# Patient Record
Sex: Male | Born: 1985 | Race: Black or African American | Hispanic: No | Marital: Married | Smoking: Current every day smoker
Health system: Southern US, Community
[De-identification: ages and names within clinical notes are randomized; demographics above are authoritative.]

---

## 2008-01-09 ENCOUNTER — Ambulatory Visit: Payer: Self-pay | Admitting: Internal Medicine

## 2008-11-01 IMAGING — CT CT ABD-PELV W/ CM
1 of 2 series · 15 of 32 positions shown, 19 images · non-contrast
Comparison: none

REASON FOR EXAM: RLQ abdominal pain, nausea and vomiting, white red cells
too numerous to count
COMMENTS:

PROCEDURE:     CT  - CT ABDOMEN / PELVIS  W  - January 09, 2008  [DATE]
RESULT:     The patient has a history of RIGHT lower quadrant pain.
TECHNIQUE: IV contrast enhanced CT of abdomen and pelvis is obtained.
There are no prior studies available for comparison.

[Series 2: appendicitis · axial · 0.74mm/px · z∈[-946,-560]mm · 15 of 146 slices shown, 19 images]
[im 11/146  soft-tissue]
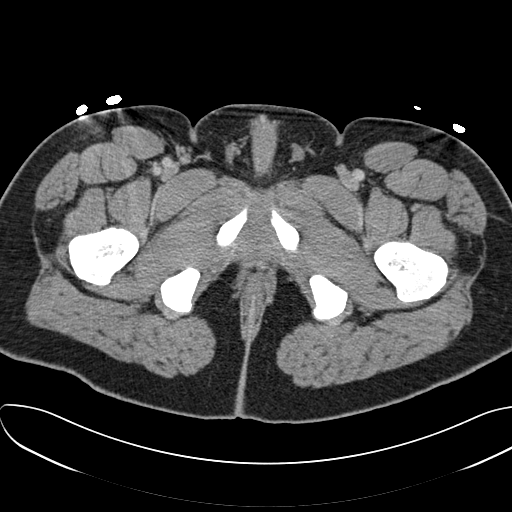
[im 11/146  bone]
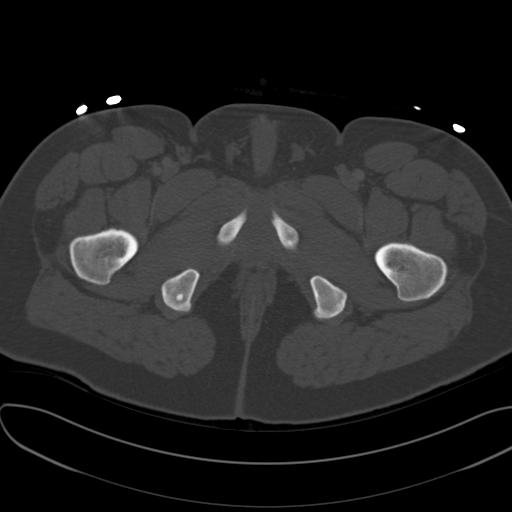
[im 22/146  soft-tissue]
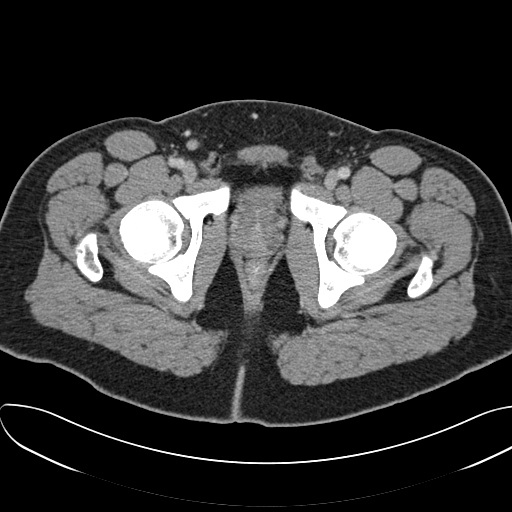
[im 33/146  soft-tissue]
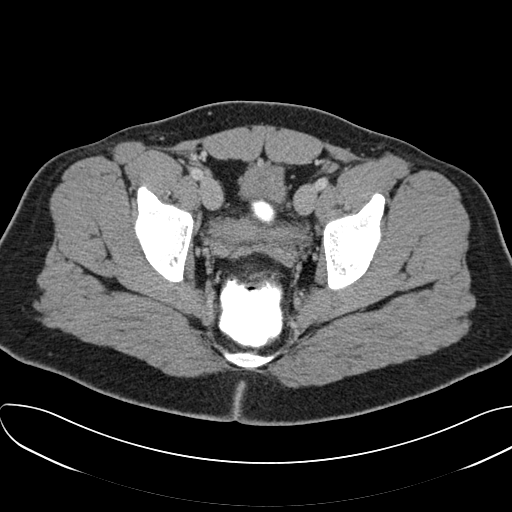
[im 43/146  soft-tissue]
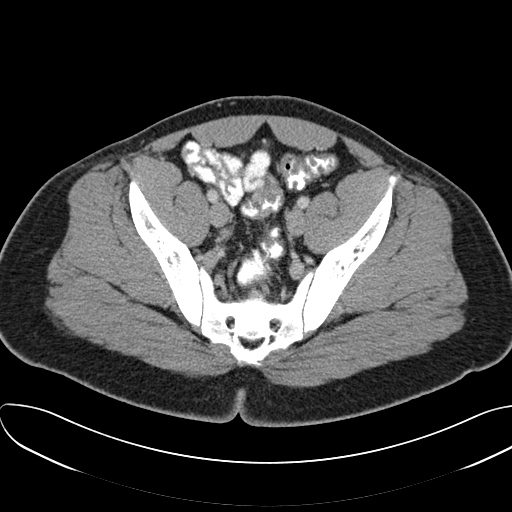
[im 54/146  soft-tissue]
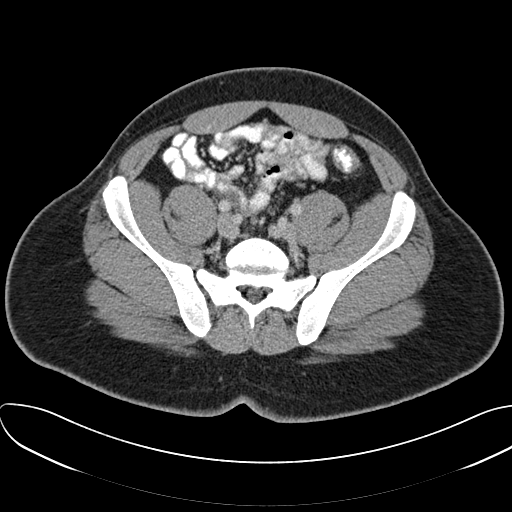
[im 65/146  soft-tissue]
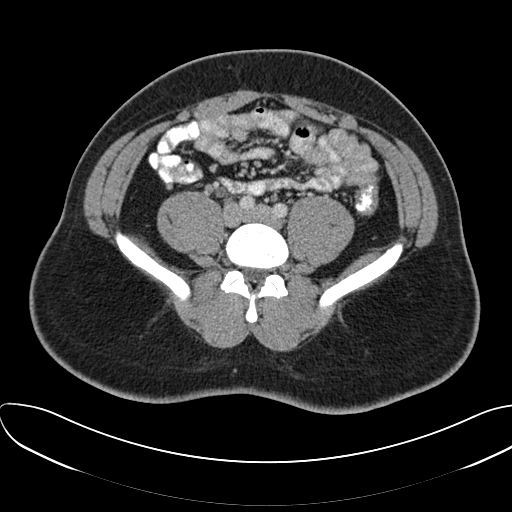
[im 76/146  soft-tissue]
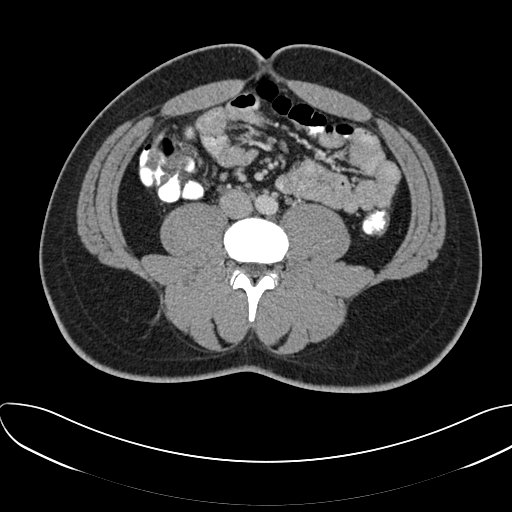
[im 86/146  soft-tissue]
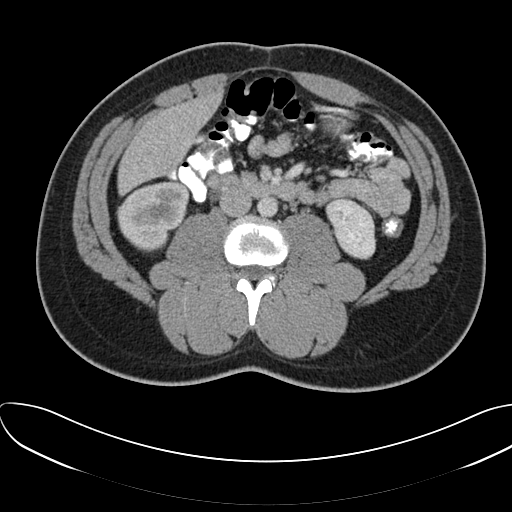
[im 97/146  soft-tissue]
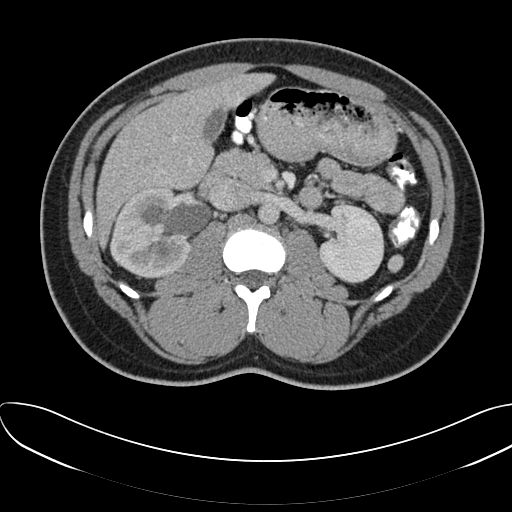
[im 97/146  bone]
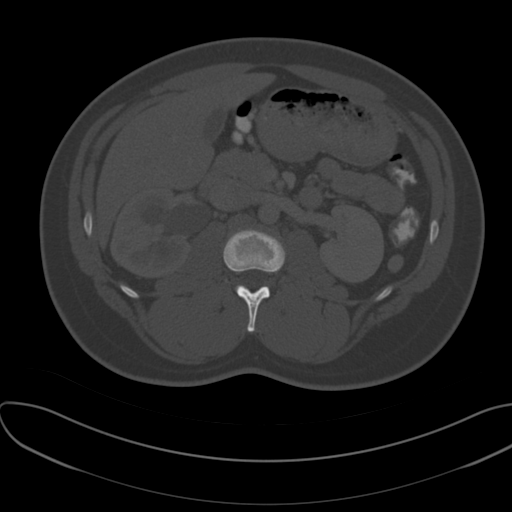
[im 108/146  soft-tissue]
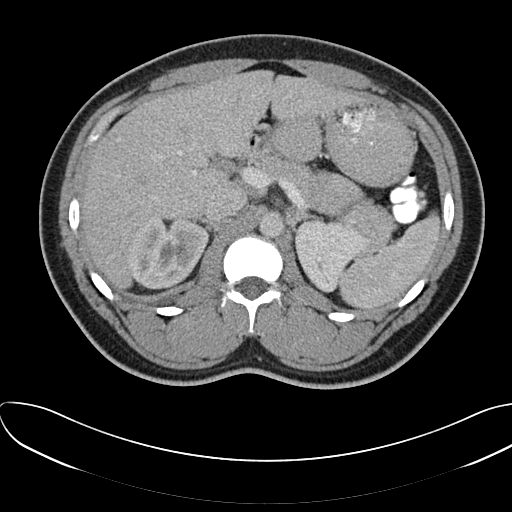
[im 119/146  soft-tissue]
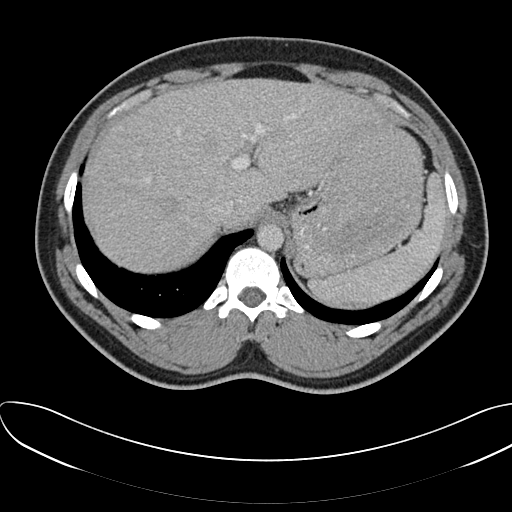
[im 124/146  lung]
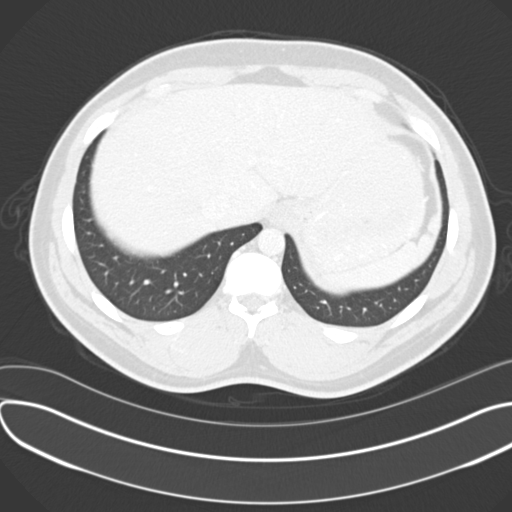
[im 129/146  soft-tissue]
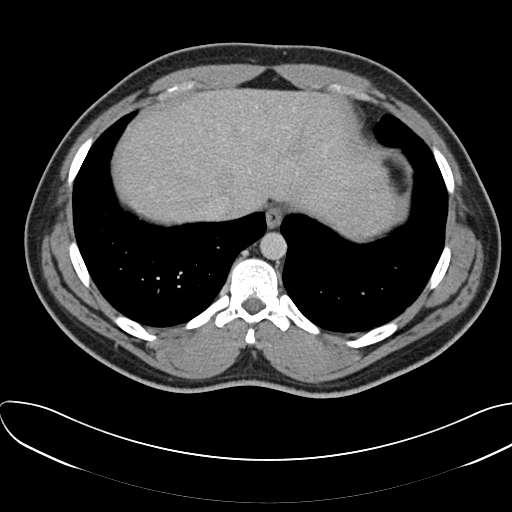
[im 129/146  lung]
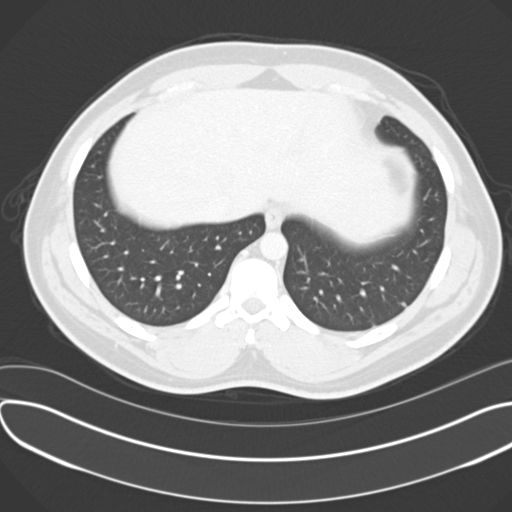
[im 135/146  lung]
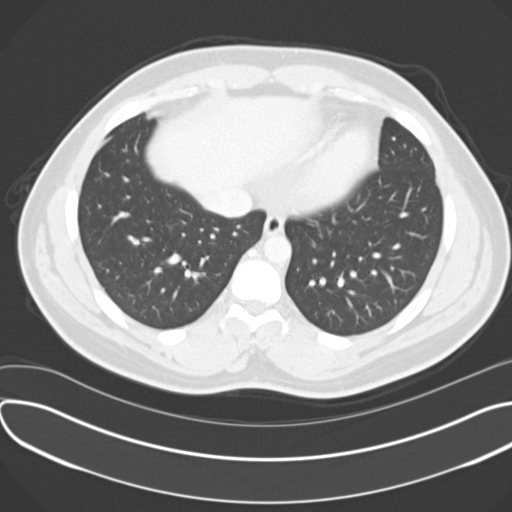
[im 140/146  soft-tissue]
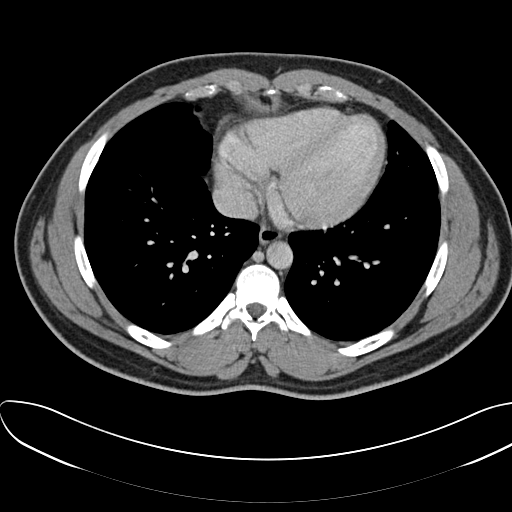
[im 140/146  lung]
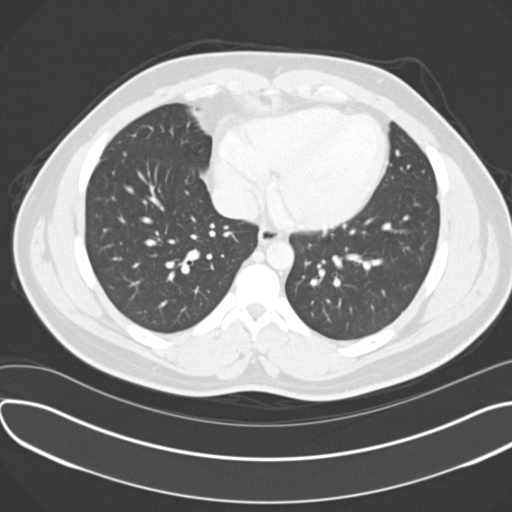

[15 of 32 positions shown; findings below may reference images not displayed]

FINDINGS: The liver and spleen are normal. The pancreas is normal. The
adrenals are normal. The LEFT kidney is unremarkable. Right hydronephrosis
and hydroureter is present. There is a 6.0 mm, distal, RIGHT ureteral stone.
The bladder is collapsed. Small inguinal lymph nodes are noted. No
pathologic pelvic fluid collections are noted. The abdominal aorta is
unremarkable. The RIGHT lower quadrant is unremarkable.
IMPRESSION: There is a 6.0 mm, distal, RIGHT ureteral stone. RIGHT
hydronephrosis and hydroureter is present.

This report was phoned to the patient's physician.

## 2014-05-06 ENCOUNTER — Emergency Department: Payer: Self-pay | Admitting: Emergency Medicine

## 2014-05-06 LAB — URINALYSIS, COMPLETE
Bacteria: NONE SEEN
Glucose,UR: NEGATIVE mg/dL (ref 0–75)
Ketone: NEGATIVE
Leukocyte Esterase: NEGATIVE
Nitrite: NEGATIVE
PH: 8 (ref 4.5–8.0)
Protein: NEGATIVE
RBC,UR: <1 /HPF (ref 0–5)
SQUAMOUS EPITHELIAL: NONE SEEN
Specific Gravity: 1.014 (ref 1.003–1.030)
WBC UR: NONE SEEN /HPF (ref 0–5)

## 2014-05-06 LAB — DRUG SCREEN, URINE
Amphetamines, Ur Screen: NEGATIVE (ref ?–1000)
Barbiturates, Ur Screen: NEGATIVE (ref ?–200)
Benzodiazepine, Ur Scrn: NEGATIVE (ref ?–200)
COCAINE METABOLITE, UR ~~LOC~~: NEGATIVE (ref ?–300)
Cannabinoid 50 Ng, Ur ~~LOC~~: NEGATIVE (ref ?–50)
MDMA (ECSTASY) UR SCREEN: NEGATIVE (ref ?–500)
Methadone, Ur Screen: NEGATIVE (ref ?–300)
Opiate, Ur Screen: NEGATIVE (ref ?–300)
Phencyclidine (PCP) Ur S: NEGATIVE (ref ?–25)
TRICYCLIC, UR SCREEN: NEGATIVE (ref ?–1000)

## 2014-05-06 LAB — CBC
HCT: 45.5 % (ref 40.0–52.0)
HGB: 14.9 g/dL (ref 13.0–18.0)
MCH: 29.3 pg (ref 26.0–34.0)
MCHC: 32.7 g/dL (ref 32.0–36.0)
MCV: 90 fL (ref 80–100)
Platelet: 216 10*3/uL (ref 150–440)
RBC: 5.08 10*6/uL (ref 4.40–5.90)
RDW: 13.7 % (ref 11.5–14.5)
WBC: 4.9 10*3/uL (ref 3.8–10.6)

## 2014-05-06 LAB — COMPREHENSIVE METABOLIC PANEL
ALBUMIN: 3.2 g/dL — AB (ref 3.4–5.0)
Alkaline Phosphatase: 61 U/L
Anion Gap: 6 — ABNORMAL LOW (ref 7–16)
BUN: 7 mg/dL (ref 7–18)
Bilirubin,Total: 0.4 mg/dL (ref 0.2–1.0)
CALCIUM: 8.4 mg/dL — AB (ref 8.5–10.1)
CHLORIDE: 107 mmol/L (ref 98–107)
CREATININE: 0.81 mg/dL (ref 0.60–1.30)
Co2: 26 mmol/L (ref 21–32)
EGFR (Non-African Amer.): >60
Glucose: 103 mg/dL — ABNORMAL HIGH (ref 65–99)
Osmolality: 276 (ref 275–301)
Potassium: 3.6 mmol/L (ref 3.5–5.1)
SGOT(AST): 23 U/L (ref 15–37)
SGPT (ALT): 42 U/L (ref 12–78)
Sodium: 139 mmol/L (ref 136–145)
TOTAL PROTEIN: 7.2 g/dL (ref 6.4–8.2)

## 2014-05-06 LAB — ACETAMINOPHEN LEVEL: Acetaminophen: <2 ug/mL

## 2014-05-06 LAB — SALICYLATE LEVEL: Salicylates, Serum: <1.7 mg/dL

## 2014-05-06 LAB — ETHANOL

## 2014-06-10 ENCOUNTER — Emergency Department: Payer: Self-pay | Admitting: Emergency Medicine

## 2015-12-08 ENCOUNTER — Encounter (HOSPITAL_BASED_OUTPATIENT_CLINIC_OR_DEPARTMENT_OTHER): Payer: Self-pay | Admitting: *Deleted

## 2015-12-08 ENCOUNTER — Emergency Department (HOSPITAL_BASED_OUTPATIENT_CLINIC_OR_DEPARTMENT_OTHER)
Admission: EM | Admit: 2015-12-08 | Discharge: 2015-12-08 | Disposition: A | Discharge status: Home / Self Care | Payer: Self-pay | Attending: Emergency Medicine | Admitting: Emergency Medicine

## 2015-12-08 DIAGNOSIS — H1132 Conjunctival hemorrhage, left eye: Secondary | ICD-10-CM | POA: Insufficient documentation

## 2015-12-08 DIAGNOSIS — Y998 Other external cause status: Secondary | ICD-10-CM | POA: Insufficient documentation

## 2015-12-08 DIAGNOSIS — Y9389 Activity, other specified: Secondary | ICD-10-CM | POA: Insufficient documentation

## 2015-12-08 DIAGNOSIS — F17210 Nicotine dependence, cigarettes, uncomplicated: Secondary | ICD-10-CM | POA: Insufficient documentation

## 2015-12-08 DIAGNOSIS — Y9289 Other specified places as the place of occurrence of the external cause: Secondary | ICD-10-CM | POA: Insufficient documentation
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
## 2015-12-08 NOTE — Discharge Instructions (Signed)
Subconjunctival Hemorrhage °Subconjunctival hemorrhage is bleeding that happens between the white part of your eye (sclera) and the clear membrane that covers the outside of your eye (conjunctiva). There are many tiny blood vessels near the surface of your eye. A subconjunctival hemorrhage happens when one or more of these vessels breaks and bleeds, causing a red patch to appear on your eye. This is similar to a bruise. °Depending on the amount of bleeding, the red patch may only cover a small area of your eye or it may cover the entire visible part of the sclera. If a lot of blood collects under the conjunctiva, there may also be swelling. Subconjunctival hemorrhages do not affect your vision or cause pain, but your eye may feel irritated if there is swelling. Subconjunctival hemorrhages usually do not require treatment, and they disappear on their own within two weeks. °CAUSES °This condition may be caused by: °· Mild trauma, such as rubbing your eye too hard. °· Severe trauma or blunt injuries. °· Coughing, sneezing, or vomiting. °· Straining, such as when lifting a heavy object. °· High blood pressure. °· Recent eye surgery. °· A history of diabetes. °· Certain medicines, especially blood thinners (anticoagulants). °· Other conditions, such as eye tumors, bleeding disorders, or blood vessel abnormalities. °Subconjunctival hemorrhages can happen without an obvious cause.  °SYMPTOMS  °Symptoms of this condition include: °· A bright red or dark red patch on the white part of the eye. °¨ The red area may spread out to cover a larger area of the eye before it goes away. °¨ The red area may turn brownish-yellow before it goes away. °· Swelling. °· Mild eye irritation. °DIAGNOSIS °This condition is diagnosed with a physical exam. If your subconjunctival hemorrhage was caused by trauma, your health care provider may refer you to an eye specialist (ophthalmologist) or another specialist to check for other injuries. You  may have other tests, including: °· An eye exam. °· A blood pressure check. °· Blood tests to check for bleeding disorders. °If your subconjunctival hemorrhage was caused by trauma, X-rays or a CT scan may be done to check for other injuries. °TREATMENT °Usually, no treatment is needed. Your health care provider may recommend eye drops or cold compresses to help with discomfort. °HOME CARE INSTRUCTIONS °· Take over-the-counter and prescription medicines only as directed by your health care provider. °· Use eye drops or cold compresses to help with discomfort as directed by your health care provider. °· Avoid activities, things, and environments that may irritate or injure your eye. °· Keep all follow-up visits as told by your health care provider. This is important. °SEEK MEDICAL CARE IF: °· You have pain in your eye. °· The bleeding does not go away within 3 weeks. °· You keep getting new subconjunctival hemorrhages. °SEEK IMMEDIATE MEDICAL CARE IF: °· Your vision changes or you have difficulty seeing. °· You suddenly develop severe sensitivity to light. °· You develop a severe headache, persistent vomiting, confusion, or abnormal tiredness (lethargy). °· Your eye seems to bulge or protrude from your eye socket. °· You develop unexplained bruises on your body. °· You have unexplained bleeding in another area of your body. °  °This information is not intended to replace advice given to you by your health care provider. Make sure you discuss any questions you have with your health care provider. °  °Document Released: 11/26/2005 Document Revised: 08/17/2015 Document Reviewed: 02/02/2015 °Elsevier Interactive Patient Education ©2016 [employer] ° °

## 2017-01-22 ENCOUNTER — Encounter (HOSPITAL_COMMUNITY): Payer: Self-pay | Admitting: *Deleted

## 2017-01-22 ENCOUNTER — Emergency Department (HOSPITAL_COMMUNITY)
Admission: EM | Admit: 2017-01-22 | Discharge: 2017-01-22 | Disposition: A | Discharge status: Home / Self Care | Payer: Self-pay | Attending: Emergency Medicine | Admitting: Emergency Medicine

## 2017-01-22 ENCOUNTER — Emergency Department (HOSPITAL_COMMUNITY): Payer: Self-pay

## 2017-01-22 DIAGNOSIS — W2201XA Walked into wall, initial encounter: Secondary | ICD-10-CM | POA: Insufficient documentation

## 2017-01-22 DIAGNOSIS — S62316A Displaced fracture of base of fifth metacarpal bone, right hand, initial encounter for closed fracture: Secondary | ICD-10-CM | POA: Insufficient documentation

## 2017-01-22 DIAGNOSIS — Y929 Unspecified place or not applicable: Secondary | ICD-10-CM | POA: Insufficient documentation

## 2017-01-22 DIAGNOSIS — F17210 Nicotine dependence, cigarettes, uncomplicated: Secondary | ICD-10-CM | POA: Insufficient documentation

## 2017-01-22 DIAGNOSIS — Z79899 Other long term (current) drug therapy: Secondary | ICD-10-CM | POA: Insufficient documentation

## 2017-01-22 DIAGNOSIS — Y939 Activity, unspecified: Secondary | ICD-10-CM | POA: Insufficient documentation

## 2017-01-22 DIAGNOSIS — Y999 Unspecified external cause status: Secondary | ICD-10-CM | POA: Insufficient documentation

## 2017-01-22 MED ORDER — IBUPROFEN 600 MG PO TABS: 600.0000 mg | ORAL_TABLET | Freq: Four times a day (QID) | ORAL | 0 refills | Status: AC | PRN

## 2017-01-22 NOTE — ED Notes (Signed)
Ortho tech en route 

## 2017-01-22 NOTE — ED Triage Notes (Signed)
Pt punched a wall, fist closed Sunday night. C/o pain in his right hand. No meds prior to arrival.

## 2017-01-22 NOTE — ED Provider Notes (Signed)
[facility]-EMERGENCY DEPT Provider Note   CSN: [id number] Arrival date & time: 01/22/17  1950    History   Chief Complaint Chief Complaint  Patient presents with  . Hand Pain    HPI Jordan Arroyo is a 31 y.o. male.  31 year old male presents to the emergency department for evaluation of right hand pain. He states that he punched a wall with a closed fist 2 days ago. Patient is complaining of a throbbing pain which has been fairly constant. It is worse with palpation to his hand. No medications taken prior to arrival. No history of broken bones. Patient is right-hand dominant.   The history is provided by the patient. No language interpreter was used.  Hand Pain     History reviewed. No pertinent past medical history.  There are no active problems to display for this patient.   History reviewed. No pertinent surgical history.     Home Medications    Prior to Admission medications   Medication Sig Start Date End Date Taking? Authorizing Provider  ibuprofen (ADVIL,MOTRIN) 600 MG tablet Take 1 tablet (600 mg total) by mouth every 6 (six) hours as needed. 01/22/17   Antony Madura, PA-C    Family History No family history on file.  Social History Social History  Substance Use Topics  . Smoking status: Current Every Day Smoker    Packs/day: 1.00    Types: Cigarettes  . Smokeless tobacco: Current User  . Alcohol use Yes     Allergies   Patient has no allergy information on record.   Review of Systems Review of Systems Ten systems reviewed and are negative for acute change, except as noted in the HPI.    Physical Exam Updated Vital Signs BP (!) 167/108 (BP Location: Left Arm)   Pulse 107   Temp 98.3 F (36.8 C) (Oral)   Resp 18   Ht 5\' 8"  (1.727 m)   Wt 94.3 kg   SpO2 100%   BMI 31.63 kg/m   Physical Exam  Constitutional: He is oriented to person, place, and time. He appears well-developed and well-nourished. No distress.  Nontoxic and in no acute  distress  HENT:  Head: Normocephalic and atraumatic.  Eyes: Conjunctivae and EOM are normal. No scleral icterus.  Neck: Normal range of motion.  Cardiovascular: Normal rate, regular rhythm and intact distal pulses.   Distal radial pulse 2+ in the right upper extremity. Capillary refill brisk in all digits of right hand.  Pulmonary/Chest: Effort normal. No respiratory distress.  Respirations even and unlabored. Lungs clear to auscultation bilaterally.  Musculoskeletal: Normal range of motion.       Right hand: He exhibits tenderness, bony tenderness and swelling. He exhibits normal range of motion and no deformity. Normal sensation noted. Decreased strength (Mild decreased strength with abduction of the right fifth digit against resistance.) noted.       Hands: Neurological: He is alert and oriented to person, place, and time. He exhibits normal muscle tone. Coordination normal.  Sensation to light touch intact in all digits of right hand.  Skin: Skin is warm and dry. No rash noted. He is not diaphoretic. No erythema. No pallor.  Psychiatric: He has a normal mood and affect. His behavior is normal.  Nursing note and vitals reviewed.    ED Treatments / Results  Labs (all labs ordered are listed, but only abnormal results are displayed) Labs Reviewed - No data to display  EKG  EKG Interpretation None  Radiology Dg Hand 2 View Right  Result Date: 01/22/2017 CLINICAL DATA:  Pain after punching wall EXAM: RIGHT HAND - 2 VIEW COMPARISON:  None. FINDINGS: Frontal and lateral views were obtained. There is a comminuted fracture of the proximal aspect of fifth metacarpal with several displaced fracture fragments proximally. No other fractures. No dislocations. Joint spaces appear normal. No erosive change. IMPRESSION: Comminuted fracture fifth metacarpal proximally with several proximal fragments displaced. No other fractures. No dislocations. No apparent arthropathy. Electronically  Signed   By: Bretta BangWilliam  Woodruff III M.D.   On: 01/22/2017 21:07    Procedures Procedures (including critical care time)  Medications Ordered in ED Medications - No data to display   Initial Impression / Assessment and Plan / ED Course  I have reviewed the triage vital signs and the nursing notes.  Pertinent labs & imaging results that were available during my care of the patient were reviewed by me and considered in my medical decision making (see chart for details).     Patient presents for right hand pain x 2 days. He is neurovascularly intact. Location of tenderness corresponds to evidence of fifth metacarpal fracture on x-ray. Patient placed in ulnar gutter splint. Will refer to orthopedics for follow-up. Return precautions discussed and provided. Patient discharged in stable condition with no unaddressed concerns.   Final Clinical Impressions(s) / ED Diagnoses   Final diagnoses:  Closed displaced fracture of base of fifth metacarpal bone of right hand, initial encounter    New Prescriptions New Prescriptions   IBUPROFEN (ADVIL,MOTRIN) 600 MG TABLET    Take 1 tablet (600 mg total) by mouth every 6 (six) hours as needed.     Antony MaduraKelly Corrinne Benegas, PA-C 01/22/17 2306    Lorre NickAnthony Allen, MD 01/24/17 [phone]

## 2017-01-22 NOTE — ED Notes (Signed)
Pt encouraged to watch and follow up with his blood pressure.

## 2017-11-15 IMAGING — CR DG HAND 2V*R*
2 series · 2 of 2 positions shown · non-contrast
Comparison: None.

CLINICAL DATA: Pain after punching wall

EXAM:
RIGHT HAND - 2 VIEW

[x hand pa right]
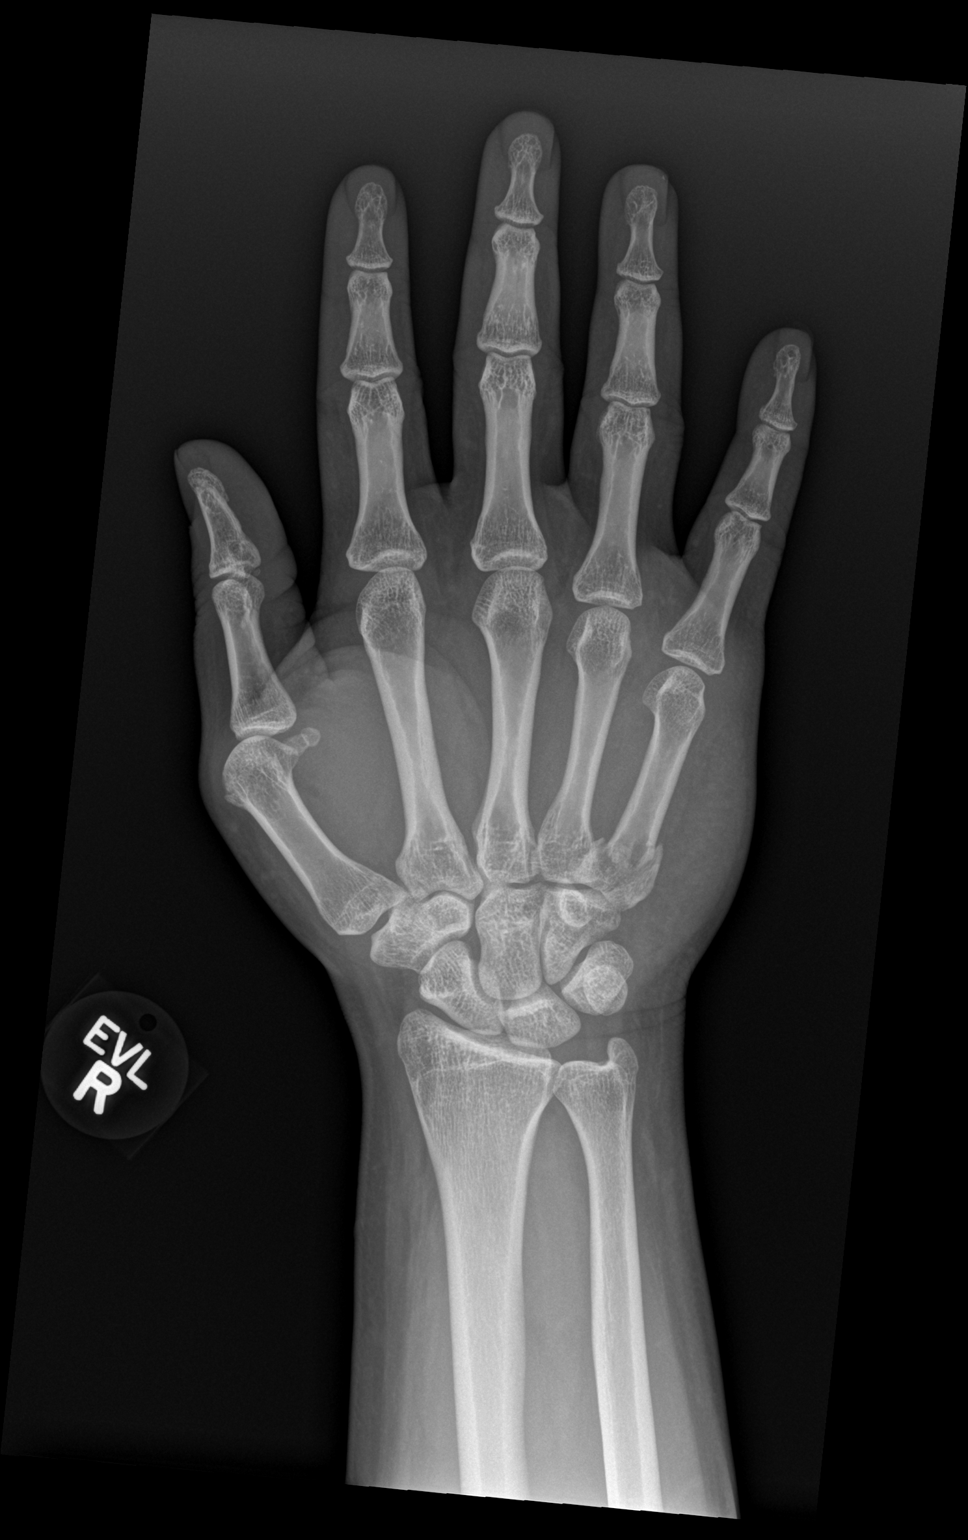

[x hand lat right]
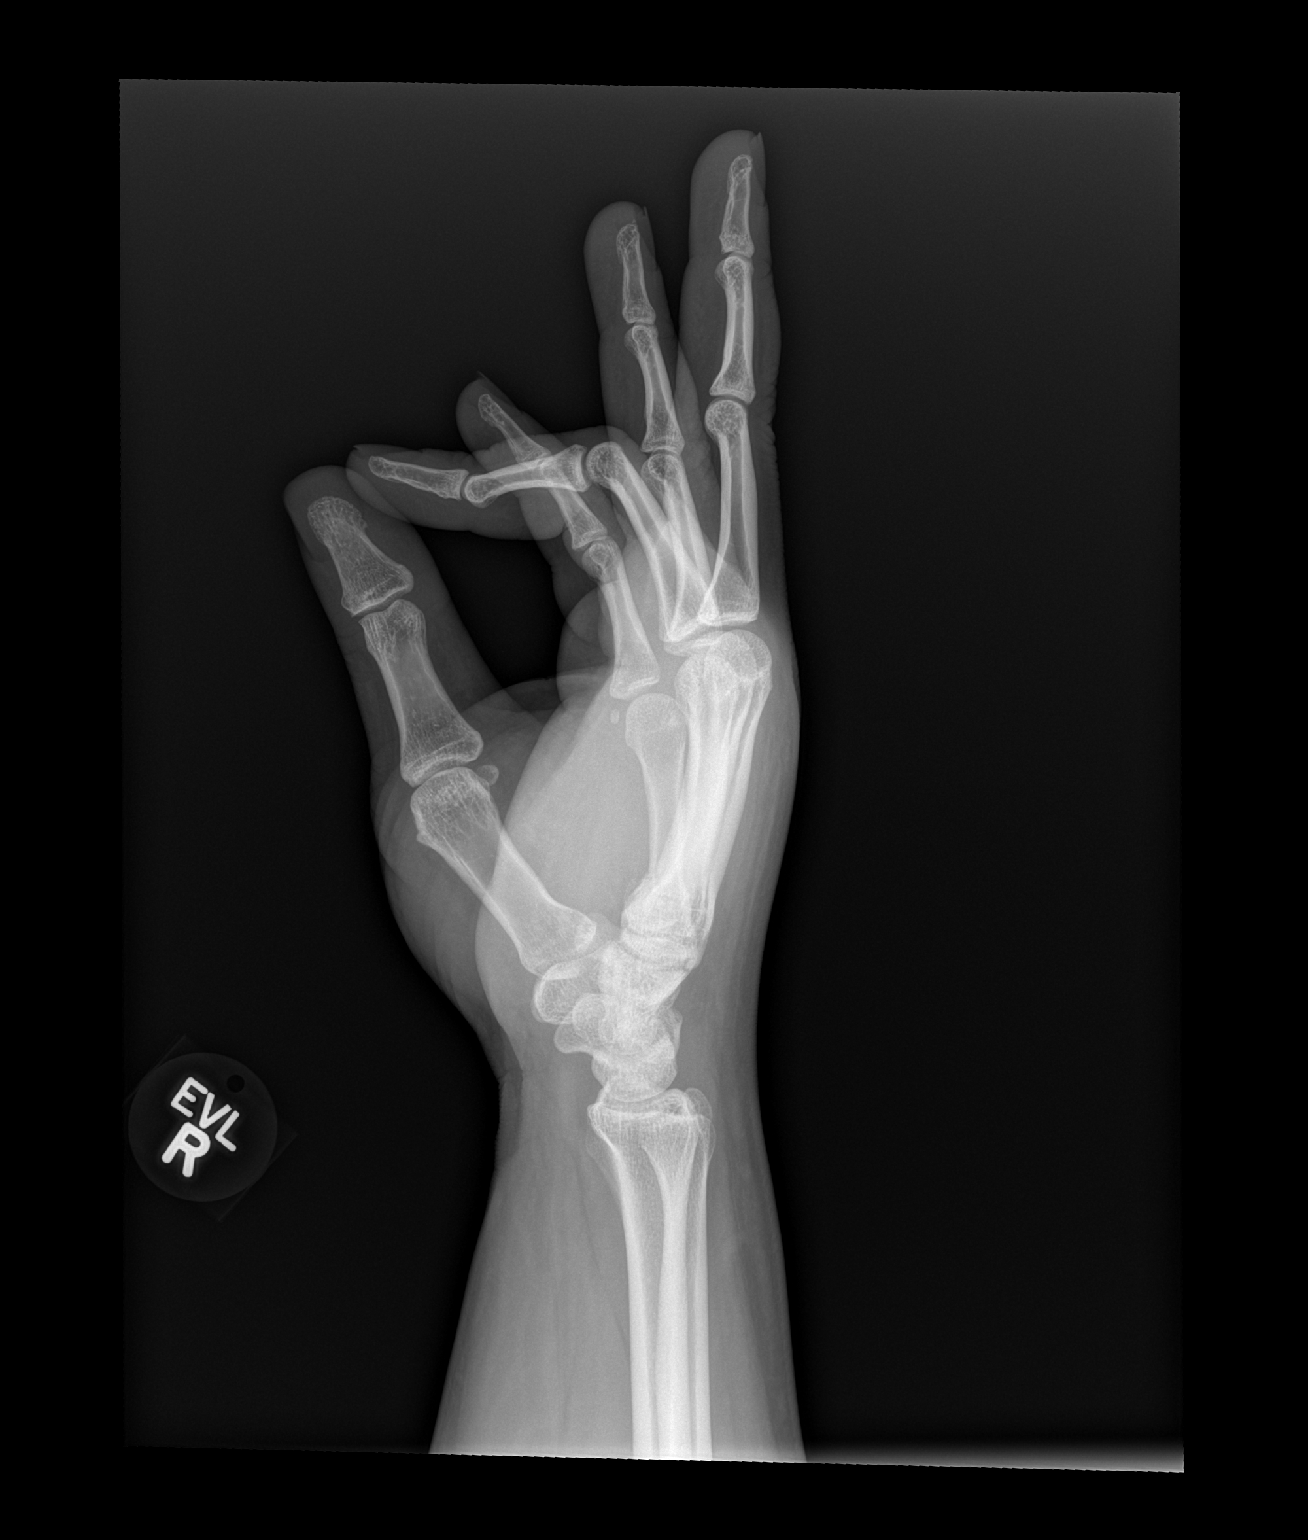

[2 of 2 positions shown; findings below may reference images not displayed]

FINDINGS: Frontal and lateral views were obtained. There is a comminuted
fracture of the proximal aspect of fifth metacarpal with several
displaced fracture fragments proximally. No other fractures. No
dislocations. Joint spaces appear normal. No erosive change.
IMPRESSION: Comminuted fracture fifth metacarpal proximally with several
proximal fragments displaced. No other fractures. No dislocations.
No apparent arthropathy.

## 2018-08-29 ENCOUNTER — Encounter: Payer: Self-pay | Admitting: Emergency Medicine

## 2018-08-29 ENCOUNTER — Emergency Department
Admission: EM | Admit: 2018-08-29 | Discharge: 2018-08-29 | Disposition: A | Discharge status: Home / Self Care | Payer: Self-pay | Attending: Student in an Organized Health Care Education/Training Program | Admitting: Student in an Organized Health Care Education/Training Program

## 2018-08-29 DIAGNOSIS — Z23 Encounter for immunization: Secondary | ICD-10-CM | POA: Insufficient documentation

## 2018-08-29 DIAGNOSIS — S61412A Laceration without foreign body of left hand, initial encounter: Secondary | ICD-10-CM | POA: Insufficient documentation

## 2018-08-29 DIAGNOSIS — F17210 Nicotine dependence, cigarettes, uncomplicated: Secondary | ICD-10-CM | POA: Insufficient documentation

## 2018-08-29 DIAGNOSIS — W25XXXA Contact with sharp glass, initial encounter: Secondary | ICD-10-CM | POA: Insufficient documentation

## 2018-08-29 DIAGNOSIS — I10 Essential (primary) hypertension: Secondary | ICD-10-CM | POA: Insufficient documentation

## 2018-08-29 DIAGNOSIS — Y929 Unspecified place or not applicable: Secondary | ICD-10-CM | POA: Insufficient documentation

## 2018-08-29 DIAGNOSIS — Y939 Activity, unspecified: Secondary | ICD-10-CM | POA: Insufficient documentation

## 2018-08-29 DIAGNOSIS — Y998 Other external cause status: Secondary | ICD-10-CM | POA: Insufficient documentation

## 2018-08-29 DIAGNOSIS — F17220 Nicotine dependence, chewing tobacco, uncomplicated: Secondary | ICD-10-CM | POA: Insufficient documentation

## 2018-08-29 MED ORDER — TETANUS-DIPHTH-ACELL PERTUSSIS 5-2.5-18.5 LF-MCG/0.5 IM SUSP
0.5000 mL | Freq: Once | INTRAMUSCULAR | Status: AC
  Administered 2018-08-29: 0.5 mL via INTRAMUSCULAR
  Filled 2018-08-29: qty 0.5

## 2018-08-29 NOTE — ED Triage Notes (Addendum)
Laceration to right hand, 2 days ago.  2 in laceration to left hand.  Bleeding controlled.  + cms to fingers.  DSD applied.  NAD

## 2018-08-29 NOTE — ED Provider Notes (Signed)
[facility] Emergency Department Provider Note  ____________________________________________  Time seen: Approximately 8:55 AM  I have reviewed the triage vital signs and the nursing notes.   HISTORY  Chief Complaint Laceration   HPI Jordan Arroyo is a 32 y.o. male who presents to the emergency department for treatment and evaluation of a laceration that he sustained to his right hand 2 days ago.  States he cut it on a piece of glass. Unknown last tetanus booster.   History reviewed. No pertinent past medical history.  There are no active problems to display for this patient.   History reviewed. No pertinent surgical history.  Prior to Admission medications   Medication Sig Start Date End Date Taking? Authorizing Provider  ibuprofen (ADVIL,MOTRIN) 600 MG tablet Take 1 tablet (600 mg total) by mouth every 6 (six) hours as needed. 01/22/17   Antony Madura, PA-C    Allergies Patient has no known allergies.  No family history on file.  Social History Social History   Tobacco Use  . Smoking status: Current Every Day Smoker    Packs/day: 1.00    Types: Cigarettes  . Smokeless tobacco: Current User  Substance Use Topics  . Alcohol use: Yes  . Drug use: Not on file    Review of Systems  Constitutional: Negative for fever. Respiratory: Negative for cough or shortness of breath.  Musculoskeletal: Negative for myalgias Skin: Positive for laceration to the left hand. Neurological: Negative for numbness or paresthesias. ____________________________________________   PHYSICAL EXAM:  VITAL SIGNS: ED Triage Vitals  Enc Vitals Group     BP 08/29/18 0834 (!) 162/105     Pulse Rate 08/29/18 0834 96     Resp 08/29/18 0834 18     Temp 08/29/18 0834 98 F (36.7 C)     Temp Source 08/29/18 0834 Oral     SpO2 08/29/18 0834 96 %     Weight 08/29/18 0826 229 lb (103.9 kg)     Height 08/29/18 0826 5\' 9"  (1.753 m)     Head Circumference --      Peak  Flow --      Pain Score 08/29/18 0826 2     Pain Loc --      Pain Edu? --      Excl. in GC? --      Constitutional: Well appearing. Eyes: Conjunctivae are clear without discharge or drainage. Nose: No rhinorrhea noted. Mouth/Throat: Airway is patent.  Neck: No stridor. Unrestricted range of motion observed. Cardiovascular: Capillary refill is <3 seconds.  Respiratory: Respirations are even and unlabored.. Musculoskeletal: Unrestricted range of motion observed. Neurologic: Awake, alert, and oriented x 4.  Skin:  4cm laceration to the left hand over the snuffbox area. Granulation tissue is present. No drainage or foreign body noted.   ____________________________________________   LABS (all labs ordered are listed, but only abnormal results are displayed)  Labs Reviewed - No data to display ____________________________________________  EKG  Not indicated. ____________________________________________  RADIOLOGY  Not indicated. ____________________________________________   PROCEDURES  .Marland KitchenLaceration Repair Date/Time: 08/29/2018 9:10 AM Performed by: Chinita Pester, FNP Authorized by: Chinita Pester, FNP   Consent:    Consent obtained:  Verbal   Consent given by:  Patient   Risks discussed:  Infection, pain, poor cosmetic result and poor wound healing   Alternatives discussed:  Observation Anesthesia (see MAR for exact dosages):    Anesthesia method:  None Laceration details:    Location:  Hand   Hand  location:  L hand, dorsum   Length (cm):  4 Repair type:    Repair type:  Simple Exploration:    Wound exploration: entire depth of wound probed and visualized     Contaminated: no   Treatment:    Area cleansed with:  Saline   Amount of cleaning:  Extensive   Irrigation solution:  Sterile saline   Visualized foreign bodies/material removed: no   Skin repair:    Repair method:  Steri-Strips   Number of Steri-Strips:  4 Approximation:    Approximation:   Loose Post-procedure details:    Dressing:  Open (no dressing)   Patient tolerance of procedure:  Tolerated well, no immediate complications   ____________________________________________   INITIAL IMPRESSION / ASSESSMENT AND PLAN / ED COURSE  Jordan Arroyo is a 32 y.o. male who presents to the emergency department for treatment and evaluation of laceration to his hand that he sustained 2 days ago on a piece of temper glass.  Wound was just through the dermal layers and granulation tissue has already closed the subcutaneous layers if they in fact were open.  Wound was loosely reapproximated with Steri-Strips.  He will be treated with Keflex to help prevent infection.  He was given strict ER return precautions and is to call and schedule follow-up with primary care for recheck of his blood pressure.  He was advised to return to the emergency department for symptoms of change or worsen if unable to schedule an appointment.   Medications  Tdap (BOOSTRIX) injection 0.5 mL (has no administration in time range)     Pertinent labs & imaging results that were available during my care of the patient were reviewed by me and considered in my medical decision making (see chart for details).  ____________________________________________   FINAL CLINICAL IMPRESSION(S) / ED DIAGNOSES  Final diagnoses:  Laceration of left hand without foreign body, initial encounter  Hypertension, unspecified type    ED Discharge Orders    None       Note:  This document was prepared using Dragon voice recognition software and may include unintentional dictation errors.    Chinita Pesterriplett, Juelz Whittenberg B, FNP 08/29/18 [phone]    Willy Eddyobinson, Patrick, MD 08/29/18 1401

## 2018-08-29 NOTE — ED Notes (Signed)
See triage note  Presents with laceration to right hand 2 days ago  States he cut it on a piece of glass

## 2019-03-04 ENCOUNTER — Encounter (HOSPITAL_COMMUNITY): Payer: Self-pay | Admitting: Emergency Medicine

## 2019-03-04 ENCOUNTER — Ambulatory Visit (HOSPITAL_COMMUNITY)
Admission: EM | Admit: 2019-03-04 | Discharge: 2019-03-04 | Disposition: A | Discharge status: Home / Self Care | Payer: Self-pay | Attending: Family Medicine | Admitting: Family Medicine

## 2019-03-04 ENCOUNTER — Other Ambulatory Visit: Payer: Self-pay

## 2019-03-04 DIAGNOSIS — J069 Acute upper respiratory infection, unspecified: Secondary | ICD-10-CM

## 2019-03-04 DIAGNOSIS — I10 Essential (primary) hypertension: Secondary | ICD-10-CM
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
## 2019-03-04 NOTE — ED Provider Notes (Signed)
[facility]   [id number] 03/04/19 Arrival Time: 1106  ASSESSMENT & PLAN:  1. Elevated blood pressure reading with diagnosis of hypertension   2. Viral upper respiratory tract infection    URI symptoms improved. Discussed elevated BP. He plans to have insurance in one month. Agrees to schedule f/u to discuss with PCP. Declines initiation of anti-HTN medication today. Work note provided.  Reviewed expectations re: course of current medical issues. Questions answered. Outlined signs and symptoms indicating need for more acute intervention. Patient verbalized understanding. After Visit Summary given.   SUBJECTIVE: History from: patient.  Jordan Arroyo is a 33 y.o. male who presents with complaint of mild URI/cold symptoms last week. Now resolved. Needs a note to return to work. No ST. Describes mild nasal congestion mainly. No coughing. No fever or body aches. No specific aggravating or alleviating factors reported. "Feel fine now." Sleeping well.  She also notices elevated BP here. Has been told in the past this has been elevated. No headaches/visual changes/CP/SOB/LE edema.  Social History   Tobacco Use  Smoking Status Current Every Day Smoker  . Packs/day: 0.25  . Types: Cigarettes  Smokeless Tobacco Never Used   ROS: As per HPI.  OBJECTIVE:  Vitals:   03/04/19 1121 03/04/19 1124  BP: (!) 165/112 (!) 161/106  Pulse: (!) 106   Resp: 12   Temp: 98.8 F (37.1 C)   TempSrc: Oral   SpO2: 100%     General appearance: alert; no distress HEENT: nasal congestion; clear runny nose; throat irritation secondary to post-nasal drainage Neck: supple without LAD CV: initially with slight tachycardia; recheck: 94 and regular Lungs: unlabored respirations, symmetrical air entry without wheezing; cough: absent Abd: soft Ext: no LE edema Skin: warm and dry Psychological: alert and cooperative; normal mood and affect  No Known Allergies   Family History  Problem  Relation Age of Onset  . Healthy Mother   . Healthy Father    Social History   Socioeconomic History  . Marital status: Married    Spouse name: Not on file  . Number of children: Not on file  . Years of education: Not on file  . Highest education level: Not on file  Occupational History  . Not on file  Social Needs  . Financial resource strain: Not on file  . Food insecurity:    Worry: Not on file    Inability: Not on file  . Transportation needs:    Medical: Not on file    Non-medical: Not on file  Tobacco Use  . Smoking status: Current Every Day Smoker    Packs/day: 0.25    Types: Cigarettes  . Smokeless tobacco: Never Used  Substance and Sexual Activity  . Alcohol use: Yes  . Drug use: Not on file  . Sexual activity: Not on file  Lifestyle  . Physical activity:    Days per week: Not on file    Minutes per session: Not on file  . Stress: Not on file  Relationships  . Social connections:    Talks on phone: Not on file    Gets together: Not on file    Attends religious service: Not on file    Active member of club or organization: Not on file    Attends meetings of clubs or organizations: Not on file    Relationship status: Not on file  . Intimate partner violence:    Fear of current or ex partner: Not on file    Emotionally  abused: Not on file    Physically abused: Not on file    Forced sexual activity: Not on file  Other Topics Concern  . Not on file  Social History Narrative  . Not on file           Mardella Layman, MD 03/09/19 [phone]
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
## 2022-06-05 NOTE — Telephone Encounter (Signed)
Formatting of this note might be different from the original.  .  Electronically signed by Rozell Searing, NP at 06/05/2022  1:00 PM EDT

## 2022-09-21 ENCOUNTER — Ambulatory Visit: Admit: 2022-09-21 | Discharge: 2022-09-21 | Payer: PRIVATE HEALTH INSURANCE | Attending: Family | Primary: Family

## 2022-09-21 DIAGNOSIS — I10 Essential (primary) hypertension: Secondary | ICD-10-CM

## 2022-09-21 DIAGNOSIS — E669 Obesity, unspecified: Secondary | ICD-10-CM

## 2022-09-21 MED ORDER — AMLODIPINE BESYLATE 5 MG PO TABS
5 MG | ORAL_TABLET | Freq: Every day | ORAL | 1 refills | Status: AC
Start: 2022-09-21 — End: ?
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
## 2022-09-27 NOTE — Unmapped (Signed)
Formatting of this note might be different from the original.  Images from the original note were not included.      [id number] en   Fall Prevention   Falls often take place due to slipping, tripping, or losing your balance. Millions of people fall every year and injure themselves. Among older adults in the [place] falls are the most common cause of traumatic brain injuries. Every 20 minutes, an older adult dies from a fall. Here are ways to reduce your risk of falling again:     Think about your fall. Was there anything that caused your fall that can be fixed, removed, or replaced?     Make your home safe by keeping walkways clear of objects you may trip over, such as electrical cords.     Use nonslip pads under rugs. Don't use area rugs or small throw rugs.     Use nonslip mats in bathtubs and showers.     Hang grab rails by the toilet and inside and outside the shower.     Install handrails and lights on staircases. The handrails should be on both sides of the stairs.     Use night lights.     Don't walk in poorly lit areas.     Don't stand on chairs or wobbly ladders.     Use care when reaching overhead or looking up. This position can cause a loss of balance.     Be sure your shoes fit well, are in good condition, and have nonslip bottoms.      Wear shoes both inside and outside of your home. Don't go barefoot or wear slippers.     Be cautious when going up and down stairs, curbs, and when walking on uneven sidewalks.     If your balance is poor, consider using a cane or walker. Talk with your healthcare provider about having a balance assessment.     If your fall was related to alcohol use, stop or limit alcohol intake. Ask your provider for help if you think you may overuse alcohol and can't stop.     If your fall was related to use of sleeping medicines, talk with your provider about this. You may need to reduce your dosage at bedtime if you wake up during the night to go to the bathroom.       To  reduce the need for nighttime bathroom trips:   o  Don't drink fluids for several hours before going to bed   o  Empty your bladder before going to bed   o  Men can keep a urinal at the bedside     Stay as active as you can. Balance, flexibility, strength, and endurance all come from exercise. They all play a role in preventing falls. Ask your provider which types of activity are right for you. Try to do some type of exercise every day.     Get your eyes checked once a year or more often if your vision changes     If you have pets, know where they are before you stand up or walk so you don't trip over them.     Go over all your medicines with a pharmacist or other provider. This is to see if any of them could make you more likely to fall. Have this type of medicine review at least once every year.     If your provider advises a new medicine, ask if the side effects will affect your  balance.     Don't move quickly from one position to another. For instance, don't stand up fast from sitting. This can cause dizziness and may lead to a fall.     Sit down when putting on pants, socks, and shoes. This will make you less likely to lose your balance and fall.     Always let your provider know if you have fallen since your last visit.     Contact your provider right away if you're having balance problems or falling more often.   Last Reviewed Date: 12/10/2020    2000-2023 [employer]. All rights reserved. This information is not intended as a substitute for professional medical care. Always follow your healthcare professional's instructions.       Electronically signed by Gweneth Dimitri, MA at 09/27/2022 10:06 AM EDT

## 2022-10-05 NOTE — Telephone Encounter (Signed)
Formatting of this note might be different from the original.  called@[id number]  unable to lv msg caller reject calls  Electronically signed by Francene Castle at 10/05/2022 11:34 AM EDT

## 2022-10-08 NOTE — Telephone Encounter (Signed)
Formatting of this note might be different from the original.  callled@[id number]  unable to lv msg caller is not accepting calls  Electronically signed by Francene Castle at 10/08/2022 11:55 AM EDT

## 2023-01-11 NOTE — Telephone Encounter (Signed)
Formatting of this note might be different from the original.  confirmed appointment   Electronically signed by Marinus Maw at 01/11/2023 11:24 AM EST
